# Patient Record
Sex: Female | Born: 2000 | Race: White | Hispanic: No | Marital: Single | Smoking: Never smoker
Health system: Southern US, Community
[De-identification: ages and names within clinical notes are randomized; demographics above are authoritative.]

---
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
## 2023-07-30 ENCOUNTER — Other Ambulatory Visit: Payer: Self-pay

## 2023-07-30 ENCOUNTER — Emergency Department (HOSPITAL_COMMUNITY)
Admission: EM | Admit: 2023-07-30 | Discharge: 2023-07-30 | Disposition: A | Discharge status: Home / Self Care | Attending: Emergency Medicine | Admitting: Emergency Medicine

## 2023-07-30 DIAGNOSIS — M5432 Sciatica, left side: Secondary | ICD-10-CM

## 2023-07-30 DIAGNOSIS — M5442 Lumbago with sciatica, left side: Secondary | ICD-10-CM | POA: Diagnosis not present

## 2023-07-30 DIAGNOSIS — M545 Low back pain, unspecified: Secondary | ICD-10-CM | POA: Diagnosis present

## 2023-07-30 MED ORDER — PREDNISONE 20 MG PO TABS: ORAL_TABLET | ORAL | 0 refills | Status: AC

## 2023-07-30 MED ORDER — KETOROLAC TROMETHAMINE 15 MG/ML IJ SOLN
15.0000 mg | Freq: Once | INTRAMUSCULAR | Status: AC
  Administered 2023-07-30: 15 mg via INTRAMUSCULAR
  Filled 2023-07-30: qty 1

## 2023-07-30 MED ORDER — IBUPROFEN 600 MG PO TABS: 600.0000 mg | ORAL_TABLET | Freq: Four times a day (QID) | ORAL | 0 refills | Status: AC | PRN

## 2023-07-30 MED ORDER — CYCLOBENZAPRINE HCL 10 MG PO TABS: 10.0000 mg | ORAL_TABLET | Freq: Two times a day (BID) | ORAL | 0 refills | Status: AC | PRN

## 2023-07-30 NOTE — ED Provider Notes (Signed)
 [place] EMERGENCY DEPARTMENT AT [facility] Provider Note   CSN: [id number] Arrival date & time: 07/30/23  9476     Patient presents with: Back Pain   Christie Jackson is a 23 y.o. female.   The history is provided by the patient and medical records. No language interpreter was used.  Back Pain Associated symptoms: no fever      23 year old female presenting with complaint of low back pain.  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  Pain is sharp shooting, goes all the way down to her foot, worsened with movement.  Pain is not likely relieved despite using over-the-counter medication.  She denies having a fever, no nausea or vomiting no abdominal pain no dysuria or hematuria no vaginal bleeding or vaginal discharge.  Last menstrual period was approximately 3 weeks ago.  She mention she has history of serrated disc and think she may have had herniated disc causing sciatica.  She also tried stretching exercise without relief.  No history of IV drug use or active cancer no history of kidney stone.  No report of any numbness or weakness.  Prior to Admission medications   Not on File    Allergies: Patient has no known allergies.    Review of Systems  Constitutional:  Negative for fever.  Musculoskeletal:  Positive for back pain.    Updated Vital Signs BP 120/89   Pulse (!) 105   Temp 98.2 F (36.8 C) (Oral)   Resp 16   SpO2 100%   Physical Exam Vitals and nursing note reviewed.  Constitutional:      General: She is not in acute distress.    Appearance: She is well-developed.  HENT:     Head: Atraumatic.  Eyes:     Conjunctiva/sclera: Conjunctivae normal.  Cardiovascular:     Rate and Rhythm: Normal rate and regular rhythm.  Pulmonary:     Effort: Pulmonary effort is normal.  Abdominal:     Palpations: Abdomen is soft.     Tenderness: There is no abdominal  tenderness.  Musculoskeletal:        General: Tenderness (Tenderness to lumbar spine with positive left straight leg raise.  No overlying skin changes.  Strength equal to bilateral lower extremities with intact distal pulses) present.     Cervical back: Neck supple.  Skin:    Findings: No rash.  Neurological:     Mental Status: She is alert.  Psychiatric:        Mood and Affect: Mood normal.     (all labs ordered are listed, but only abnormal results are displayed) Labs Reviewed  POC URINE PREG, ED    EKG: None  Radiology: No results found.   Procedures   Medications Ordered in the ED  ketorolac  (TORADOL ) 15 MG/ML injection 15 mg (15 mg Intramuscular Given 07/30/23 0640)                                    Medical Decision Making Risk Prescription drug management.   BP 120/89   Pulse (!) 105   Temp 98.2 F (36.8 C) (Oral)   Resp 16   SpO2 100%   73:6 AM  23 year old female presenting with complaint of low back pain.  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  Pain is sharp shooting, goes all the way down to her foot, worsened with movement.  Pain is not likely relieved despite using over-the-counter medication.  She denies having a fever, no nausea or vomiting no abdominal pain no dysuria or hematuria no vaginal bleeding or vaginal discharge.  Last menstrual period was approximately 3 weeks ago.  She mention she has history of serrated disc and think she may have had herniated disc causing sciatica.  She also tried stretching exercise without relief.  No history of IV drug use or active cancer no history of kidney stone.  No report of any numbness or weakness.  On exam, patient is laying in bed in a supine position.  She has tenderness to her lumbar spine on palpation and decreased range of motion secondary to pain.  She has positive left straight leg raise suggestive of  sciatica.  She is neurovascularly intact.  Abdomen is soft nontender.  Vital signs notable for mild tachycardia with heart rate of 105, likely related to pain.  Will treat symptoms with Toradol  IM, anticipate discharging home with supportive care which includes prednisone , Flexeril , and ibuprofen .  Will check preg test as well.  Advanced imaging including lumbar spine MRI considered but not performed as patient is without any red flags and she is neurovascular intact.  Doubt cauda equina  Given Toradol , patient appears more comfortable and states pain has improved.  She is able to ambulate and she is stable for discharge.  I have low suspicion for kidney stone as the cause of her symptoms.     Final diagnoses:  Sciatica, left side    ED Discharge Orders          Ordered    predniSONE  (DELTASONE ) 20 MG tablet        07/30/23 0732    ibuprofen  (ADVIL ) 600 MG tablet  Every 6 hours PRN        07/30/23 0732    cyclobenzaprine  (FLEXERIL ) 10 MG tablet  2 times daily PRN        07/30/23 0732               Nivia Colon, PA-C 07/30/23 0733    Mannie Pac T, DO 07/30/23 1547

## 2023-07-30 NOTE — ED Triage Notes (Signed)
 Patient reports left lower back pain onset last night , denies injury or fall , no urinary discomfort , pain increases with movement /changing positions .
